# Patient Record
Sex: Female | Born: 1965 | Race: Black or African American | Hispanic: No | Marital: Married | Smoking: Never smoker
Health system: Southern US, Community
[De-identification: ages and names within clinical notes are randomized; demographics above are authoritative.]

## PROBLEM LIST (undated history)

## (undated) DIAGNOSIS — L732 Hidradenitis suppurativa: Secondary | ICD-10-CM

## (undated) HISTORY — PX: HYDRADENITIS EXCISION: SHX5243

## (undated) HISTORY — PX: BREAST BIOPSY: SHX20

## (undated) HISTORY — PX: BREAST CYST ASPIRATION: SHX578

---

## 2006-06-19 ENCOUNTER — Ambulatory Visit: Payer: Self-pay | Admitting: General Surgery

## 2007-08-02 ENCOUNTER — Ambulatory Visit: Payer: Self-pay | Admitting: General Surgery

## 2008-08-21 ENCOUNTER — Ambulatory Visit: Payer: Self-pay | Admitting: General Surgery

## 2008-08-22 ENCOUNTER — Ambulatory Visit: Payer: Self-pay | Admitting: General Surgery

## 2008-08-24 ENCOUNTER — Ambulatory Visit: Payer: Self-pay | Admitting: General Surgery

## 2015-10-01 ENCOUNTER — Encounter: Payer: Self-pay | Admitting: *Deleted

## 2015-10-01 ENCOUNTER — Ambulatory Visit
Admission: EM | Admit: 2015-10-01 | Discharge: 2015-10-01 | Disposition: A | Discharge status: Home / Self Care | Payer: Managed Care, Other (non HMO) | Attending: Family Medicine | Admitting: Family Medicine

## 2015-10-01 DIAGNOSIS — H109 Unspecified conjunctivitis: Secondary | ICD-10-CM

## 2015-10-01 HISTORY — DX: Hidradenitis suppurativa: L73.2

## 2015-10-01 MED ORDER — MOXIFLOXACIN HCL 0.5 % OP SOLN: 1.0000 [drp] | Freq: Three times a day (TID) | OPHTHALMIC | Status: AC

## 2015-10-01 NOTE — ED Notes (Signed)
Patient awoke this past Saturday with redness in her left eye. This am she was unable to open her left eye and pain is present.

## 2015-10-01 NOTE — ED Provider Notes (Signed)
CSN: [id number]     Arrival date & time 10/01/15  0947 History   First MD Initiated Contact with Patient 10/01/15 1056     Chief Complaint  Patient presents with  . Conjunctivitis    left eye   (Consider location/radiation/quality/duration/timing/severity/associated sxs/prior Treatment) Patient is a 50 y.o. female presenting with conjunctivitis. The history is provided by the patient.  Conjunctivitis This is a new (states exposure to sick contact with conjunctivitis recently) problem. The current episode started 2 days ago. The problem occurs constantly. The problem has been gradually worsening. Associated symptoms comments: Left eye redness, drainage; matted shut in am.    Past Medical History  Diagnosis Date  . Hydradenitis    Past Surgical History  Procedure Laterality Date  . Hydradenitis excision Left     axillary, groin   History reviewed. No pertinent family history. Social History  Substance Use Topics  . Smoking status: Never Smoker   . Smokeless tobacco: Never Used  . Alcohol Use: No   OB History    No data available     Review of Systems  Allergies  Review of patient's allergies indicates no known allergies.  Home Medications   Prior to Admission medications   Medication Sig Start Date End Date Taking? Authorizing Provider  moxifloxacin (VIGAMOX) 0.5 % ophthalmic solution Place 1 drop into the left eye 3 (three) times daily. 10/01/15   Payton Mccallumrlando Osbaldo Mark, MD   Meds Ordered and Administered this Visit  Medications - No data to display  BP 146/78 mmHg  Pulse 88  Temp(Src) 98.1 F (36.7 C) (Oral)  Resp 18  Ht 5\' 7"  (1.702 m)  Wt 240 lb (108.863 kg)  BMI 37.58 kg/m2  SpO2 96% No data found.   Physical Exam  Constitutional: She appears well-nourished. No distress.  Eyes: EOM are normal. Pupils are equal, round, and reactive to light. Right eye exhibits no discharge and no exudate. Left eye exhibits discharge. Right conjunctiva is not injected. Right  conjunctiva has no hemorrhage. Left conjunctiva is injected. Left conjunctiva has no hemorrhage.  Skin: She is not diaphoretic.  Nursing note and vitals reviewed.   ED Course  Procedures (including critical care time)  Labs Review Labs Reviewed - No data to display  Imaging Review No results found.   Visual Acuity Review  Right Eye Distance:   Left Eye Distance:   Bilateral Distance:    Right Eye Near:   Left Eye Near:    Bilateral Near:         MDM   1. Conjunctivitis, left eye    New Prescriptions   MOXIFLOXACIN (VIGAMOX) 0.5 % OPHTHALMIC SOLUTION    Place 1 drop into the left eye 3 (three) times daily.   1.  diagnosis reviewed with patient 2. rx as per orders above; reviewed possible side effects, interactions, risks and benefits  3. Recommend supportive treatment with cool compresses 4. Follow-up prn if symptoms worsen or don't improve    Payton Mccallumrlando Desira Alessandrini, MD 10/01/15 1108

## 2016-04-09 ENCOUNTER — Other Ambulatory Visit: Payer: Self-pay | Admitting: Family Medicine

## 2016-04-09 DIAGNOSIS — Z1231 Encounter for screening mammogram for malignant neoplasm of breast: Secondary | ICD-10-CM

## 2016-04-21 ENCOUNTER — Ambulatory Visit: Admission: RE | Admit: 2016-04-21 | Payer: Managed Care, Other (non HMO) | Source: Ambulatory Visit

## 2016-10-23 HISTORY — PX: BREAST CYST EXCISION: SHX579

## 2016-11-24 ENCOUNTER — Ambulatory Visit
Admission: EM | Admit: 2016-11-24 | Discharge: 2016-11-24 | Disposition: A | Discharge status: Home / Self Care | Payer: Managed Care, Other (non HMO) | Attending: Family Medicine | Admitting: Family Medicine

## 2016-11-24 DIAGNOSIS — Z5189 Encounter for other specified aftercare: Secondary | ICD-10-CM

## 2016-11-24 NOTE — ED Triage Notes (Addendum)
Patient is here today for removal of packing from left axillary abscess that was placed on Friday evening. Patient was placed on Clindamycin and Oxycodone from [facility].

## 2016-11-24 NOTE — ED Provider Notes (Signed)
[facility]    CSN: [id number] Arrival date & time: 11/24/16  1247     History   Chief Complaint Chief Complaint  Patient presents with  . Wound Check    HPI Christie Castro is a 51 y.o. female.   51 yo female with a c/o abscess to the left axilla that was I&D'd in the ED 3 days ago. Wound was packed and patient started on oral antibiotic. Patient here for recheck of wound and repacking. States symptoms are improving. Denies any fevers or chills.    The history is provided by the patient.  Wound Check  This is a new problem. The current episode started more than 2 days ago.    Past Medical History:  Diagnosis Date  . Hydradenitis     There are no active problems to display for this patient.   Past Surgical History:  Procedure Laterality Date  . HYDRADENITIS EXCISION Left    axillary, groin    OB History    No data available       Home Medications    Prior to Admission medications   Medication Sig Start Date End Date Taking? Authorizing Provider  clindamycin (CLEOCIN) 150 MG capsule Take 300 mg by mouth every 6 (six) hours.   Yes Historical Provider, MD  oxyCODONE-acetaminophen (PERCOCET/ROXICET) 5-325 MG tablet Take by mouth every 4 (four) hours as needed for severe pain.   Yes Historical Provider, MD  moxifloxacin (VIGAMOX) 0.5 % ophthalmic solution Place 1 drop into the left eye 3 (three) times daily. 10/01/15   Christie Mccallumrlando Kecia Swoboda, MD    Family History History reviewed. No pertinent family history.  Social History Social History  Substance Use Topics  . Smoking status: Never Smoker  . Smokeless tobacco: Never Used  . Alcohol use No     Allergies   Patient has no known allergies.   Review of Systems Review of Systems   Physical Exam Triage Vital Signs ED Triage Vitals  Enc Vitals Group     BP 11/24/16 1302 (!) 151/86     Pulse Rate 11/24/16 1302 (!) 103     Resp 11/24/16 1302 18     Temp 11/24/16 1302 98.1 F (36.7 C)     Temp  Source 11/24/16 1302 Oral     SpO2 11/24/16 1302 98 %     Weight 11/24/16 1301 240 lb (108.9 kg)     Height 11/24/16 1301 5\' 7"  (1.702 m)     Head Circumference --      Peak Flow --      Pain Score 11/24/16 1304 5     Pain Loc --      Pain Edu? --      Excl. in GC? --    No data found.   Updated Vital Signs BP (!) 151/86 (BP Location: Left Arm)   Pulse (!) 103   Temp 98.1 F (36.7 C) (Oral)   Resp 18   Ht 5\' 7"  (1.702 m)   Wt 240 lb (108.9 kg)   SpO2 98%   BMI 37.59 kg/m   Visual Acuity Right Eye Distance:   Left Eye Distance:   Bilateral Distance:    Right Eye Near:   Left Eye Near:    Bilateral Near:     Physical Exam  Constitutional: She appears well-developed and well-nourished. No distress.  Skin: She is not diaphoretic.  Nursing note and vitals reviewed.    UC Treatments / Results  Labs (all labs  ordered are listed, but only abnormal results are displayed) Labs Reviewed - No data to display  EKG  EKG Interpretation None       Radiology No results found.  Procedures Procedures (including critical care time)  Medications Ordered in UC Medications - No data to display   Initial Impression / Assessment and Plan / UC Course  I have reviewed the triage vital signs and the nursing notes.  Pertinent labs & imaging results that were available during my care of the patient were reviewed by me and considered in my medical decision making (see chart for details).       Final Clinical Impressions(s) / UC Diagnoses   Final diagnoses:  Abscess re-check    New Prescriptions Discharge Medication List as of 11/24/2016  1:46 PM     1. diagnosis reviewed with patient 2. Packing removed from wound, purulent drainage expresses from wound and wound repacked  3. Recommend supportive treatment with warm compresses to area 4. Continue current oral antibiotic 4. Follow-up in 2-3 days for recheck and repacking of wound or prn if symptoms worsen      Christie Mccallum, MD 11/24/16 2000

## 2016-11-24 NOTE — Discharge Instructions (Signed)
Continue warm compresses to area and current antibiotic Follow up in 2-3 days for recheck and repacking

## 2016-11-27 ENCOUNTER — Telehealth: Payer: Self-pay

## 2016-11-27 ENCOUNTER — Ambulatory Visit
Admission: EM | Admit: 2016-11-27 | Discharge: 2016-11-27 | Disposition: A | Discharge status: Home / Self Care | Payer: BLUE CROSS/BLUE SHIELD | Attending: Family Medicine | Admitting: Family Medicine

## 2016-11-27 DIAGNOSIS — Z5189 Encounter for other specified aftercare: Secondary | ICD-10-CM | POA: Diagnosis not present

## 2016-11-27 MED ORDER — MUPIROCIN 2 % EX OINT: 1.0000 "application " | TOPICAL_OINTMENT | Freq: Three times a day (TID) | CUTANEOUS | 0 refills | Status: DC

## 2016-11-27 MED ORDER — MUPIROCIN 2 % EX OINT: 1.0000 "application " | TOPICAL_OINTMENT | Freq: Three times a day (TID) | CUTANEOUS | 1 refills | Status: AC

## 2016-11-27 NOTE — ED Provider Notes (Signed)
[facility]    CSN: [id number] Arrival date & time: 11/27/16  1450     History   Chief Complaint Chief Complaint  Patient presents with  . Wound Check    HPI Christie Castro is a 51 y.o. female.   Patient's here because of wound check. Please see note that was dictated 3 days ago. She had I&D and [facility] first time was unsuccessful second time on Friday followed by visit here on Monday for repacking. She is hoping that the wound does not need to be repacked again. She has a history of hidradenitis. She is also no known history of drug allergies nor does she smoke.   The history is provided by the patient. No language interpreter was used.    Past Medical History:  Diagnosis Date  . Hydradenitis     There are no active problems to display for this patient.   Past Surgical History:  Procedure Laterality Date  . HYDRADENITIS EXCISION Left    axillary, groin    OB History    No data available       Home Medications    Prior to Admission medications   Medication Sig Start Date End Date Taking? Authorizing Provider  clindamycin (CLEOCIN) 150 MG capsule Take 300 mg by mouth every 6 (six) hours.   Yes Historical Provider, MD  moxifloxacin (VIGAMOX) 0.5 % ophthalmic solution Place 1 drop into the left eye 3 (three) times daily. 10/01/15  Yes [name], MD  oxyCODONE-acetaminophen (PERCOCET/ROXICET) 5-325 MG tablet Take by mouth every 4 (four) hours as needed for severe pain.   Yes Historical Provider, MD  mupirocin ointment (BACTROBAN) 2 % Apply 1 application topically 3 (three) times daily. 11/27/16   Hassan Rowan, MD    Family History History reviewed. No pertinent family history.  Social History Social History  Substance Use Topics  . Smoking status: Never Smoker  . Smokeless tobacco: Never Used  . Alcohol use No     Allergies   Patient has no known allergies.   Review of Systems Review of Systems  Skin: Positive for wound.  All other systems  reviewed and are negative.    Physical Exam Triage Vital Signs ED Triage Vitals  Enc Vitals Group     BP 11/27/16 1547 137/80     Pulse Rate 11/27/16 1547 79     Resp 11/27/16 1547 17     Temp 11/27/16 1547 98.5 F (36.9 C)     Temp Source 11/27/16 1547 Oral     SpO2 11/27/16 1547 100 %     Weight 11/27/16 1546 240 lb (108.9 kg)     Height 11/27/16 1546 5\' 7"  (1.702 m)     Head Circumference --      Peak Flow --      Pain Score 11/27/16 1547 0     Pain Loc --      Pain Edu? --      Excl. in GC? --    No data found.   Updated Vital Signs BP 137/80 (BP Location: Left Arm)   Pulse 79   Temp 98.5 F (36.9 C) (Oral)   Resp 17   Ht 5\' 7"  (1.702 m)   Wt 240 lb (108.9 kg)   SpO2 100%   BMI 37.59 kg/m   Visual Acuity Right Eye Distance:   Left Eye Distance:   Bilateral Distance:    Right Eye Near:   Left Eye Near:    Bilateral Near:  Physical Exam  Constitutional: She is oriented to person, place, and time. She appears well-developed and well-nourished.  HENT:  Head: Normocephalic.  Right Ear: External ear normal.  Left Ear: External ear normal.  Eyes: Pupils are equal, round, and reactive to light.  Neck: Normal range of motion.  Pulmonary/Chest: Effort normal.  Musculoskeletal: Normal range of motion.  Neurological: She is alert and oriented to person, place, and time.  Skin: Skin is warm. There is erythema.  She has had a left axillary wound that is gaping open and draining. Packing was removed. We'll place him back pain ointment 2-3 times a day and follow-up with  Psychiatric: She has a normal mood and affect.  Vitals reviewed.    UC Treatments / Results  Labs (all labs ordered are listed, but only abnormal results are displayed) Labs Reviewed - No data to display  EKG  EKG Interpretation None       Radiology No results found.  Procedures Procedures (including critical care time)  Medications Ordered in UC Medications - No data to  display   Initial Impression / Assessment and Plan / UC Course  I have reviewed the triage vital signs and the nursing notes.  Pertinent labs & imaging results that were available during my care of the patient were reviewed by me and considered in my medical decision making (see chart for details).     Will continue using Bactroban ointment to 3 times a day as needed for his antibiotic return as needed  Final Clinical Impressions(s) / UC Diagnoses   Final diagnoses:  Visit for wound check  Wound check, abscess    New Prescriptions New Prescriptions   MUPIROCIN OINTMENT (BACTROBAN) 2 %    Apply 1 application topically 3 (three) times daily.     Hassan RowanEugene Royanne Warshaw, MD 11/27/16 [phone]

## 2016-11-27 NOTE — Telephone Encounter (Signed)
Courtesy call back completed today after patient's visit at [facility]. Patient improved and will call back with any questions or concerns.  

## 2016-11-27 NOTE — ED Triage Notes (Signed)
Patient states that area is better and has not been draining like it was.

## 2017-01-22 ENCOUNTER — Other Ambulatory Visit: Payer: Self-pay | Admitting: Medical Oncology

## 2017-01-22 DIAGNOSIS — Z1231 Encounter for screening mammogram for malignant neoplasm of breast: Secondary | ICD-10-CM

## 2017-01-26 ENCOUNTER — Inpatient Hospital Stay: Admission: RE | Admit: 2017-01-26 | Payer: BLUE CROSS/BLUE SHIELD | Source: Ambulatory Visit

## 2017-01-28 ENCOUNTER — Ambulatory Visit
Admission: RE | Admit: 2017-01-28 | Discharge: 2017-01-28 | Disposition: A | Discharge status: Home / Self Care | Payer: BLUE CROSS/BLUE SHIELD | Source: Ambulatory Visit | Attending: Medical Oncology | Admitting: Medical Oncology

## 2017-01-28 DIAGNOSIS — Z1231 Encounter for screening mammogram for malignant neoplasm of breast: Secondary | ICD-10-CM | POA: Diagnosis not present

## 2017-02-02 ENCOUNTER — Other Ambulatory Visit: Payer: Self-pay | Admitting: Medical Oncology

## 2017-02-02 DIAGNOSIS — N6310 Unspecified lump in the right breast, unspecified quadrant: Secondary | ICD-10-CM

## 2017-02-02 DIAGNOSIS — R928 Other abnormal and inconclusive findings on diagnostic imaging of breast: Secondary | ICD-10-CM

## 2017-02-20 ENCOUNTER — Ambulatory Visit
Admission: RE | Admit: 2017-02-20 | Discharge: 2017-02-20 | Disposition: A | Discharge status: Home / Self Care | Payer: BLUE CROSS/BLUE SHIELD | Source: Ambulatory Visit | Attending: Medical Oncology | Admitting: Medical Oncology

## 2017-02-20 ENCOUNTER — Other Ambulatory Visit: Payer: BLUE CROSS/BLUE SHIELD

## 2017-03-09 ENCOUNTER — Other Ambulatory Visit: Payer: BLUE CROSS/BLUE SHIELD

## 2017-03-09 ENCOUNTER — Ambulatory Visit: Payer: BLUE CROSS/BLUE SHIELD | Attending: Medical Oncology

## 2020-01-04 ENCOUNTER — Other Ambulatory Visit: Payer: Self-pay | Admitting: Medical Oncology

## 2020-01-04 DIAGNOSIS — R928 Other abnormal and inconclusive findings on diagnostic imaging of breast: Secondary | ICD-10-CM

## 2020-01-13 ENCOUNTER — Ambulatory Visit
Admission: RE | Admit: 2020-01-13 | Discharge: 2020-01-13 | Disposition: A | Discharge status: Home / Self Care | Payer: BLUE CROSS/BLUE SHIELD | Source: Ambulatory Visit | Attending: Medical Oncology | Admitting: Medical Oncology

## 2020-01-13 DIAGNOSIS — R928 Other abnormal and inconclusive findings on diagnostic imaging of breast: Secondary | ICD-10-CM

## 2020-10-08 IMAGING — MG DIGITAL DIAGNOSTIC BILAT W/ TOMO W/ CAD
6 of 10 series · 6 of 30 positions shown · non-contrast
Comparison: Previous exam(s).

CLINICAL DATA: Patient was called back from screening mammogram in
Friday January, 2017 for a right breast mass. The patient did not return for
the additional imaging.

EXAM:
DIGITAL DIAGNOSTIC BILATERAL MAMMOGRAM WITH CAD AND TOMO
ULTRASOUND RIGHT BREAST

[R MLO synth-2D]
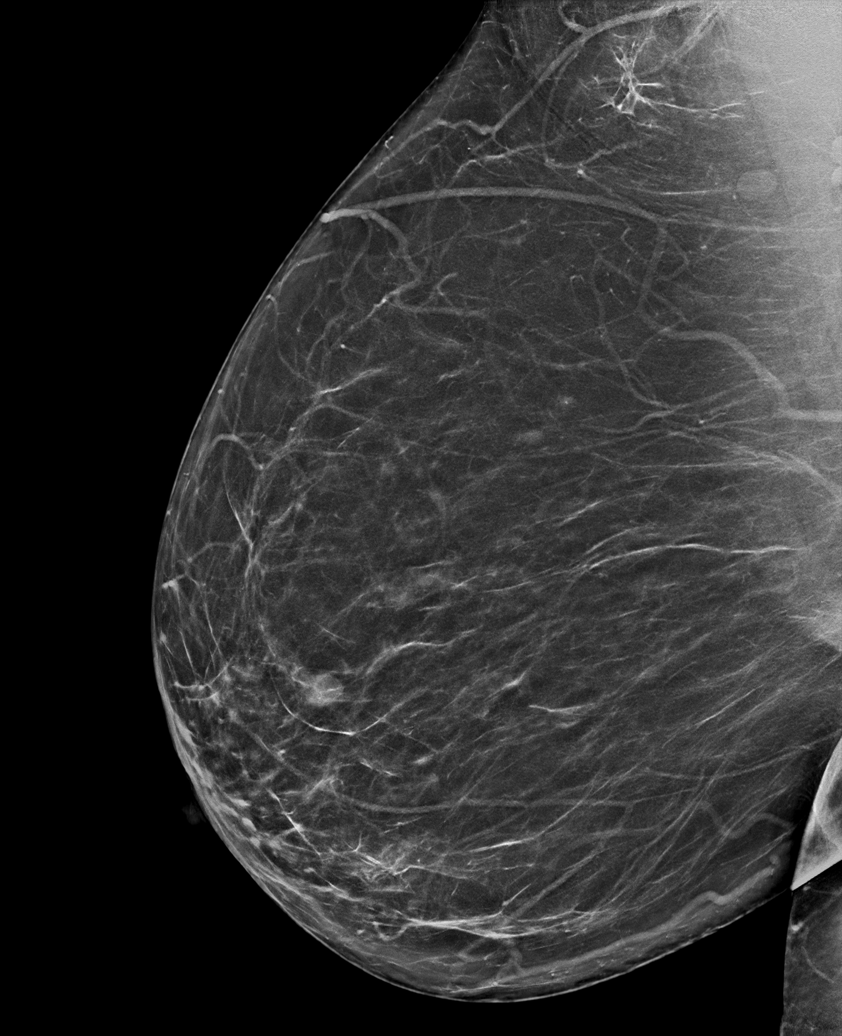

[R XCCL synth-2D]
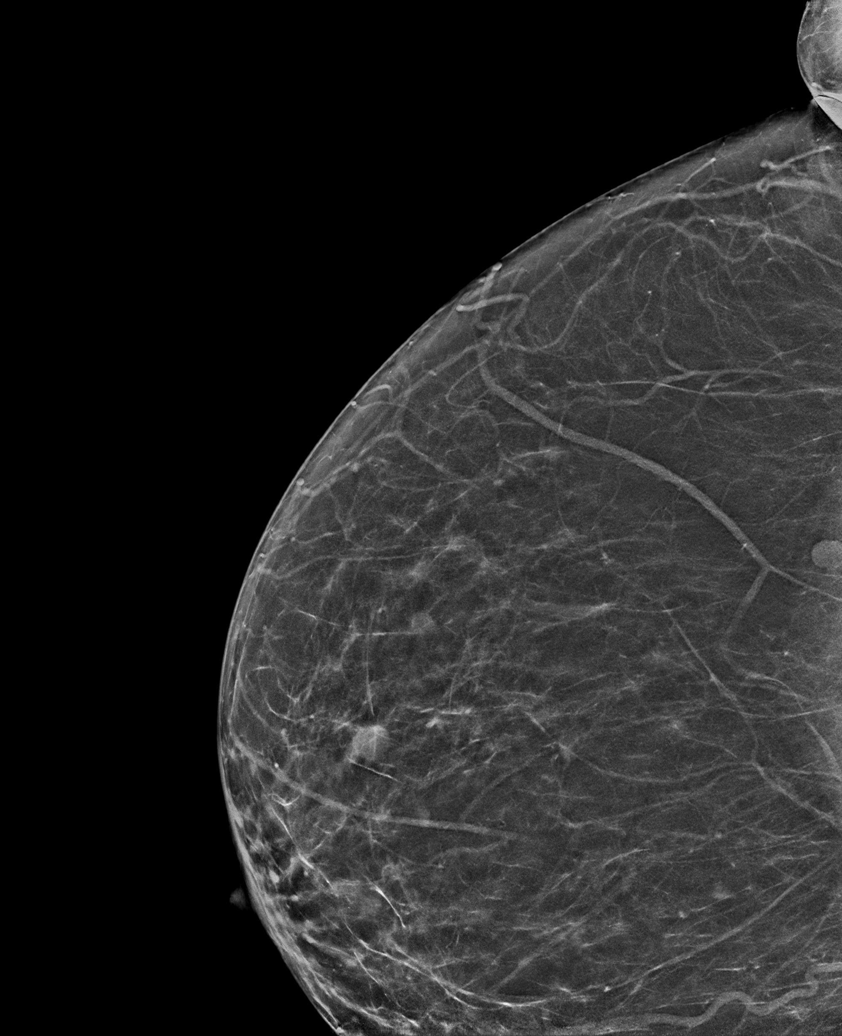

[L MLO synth-2D]
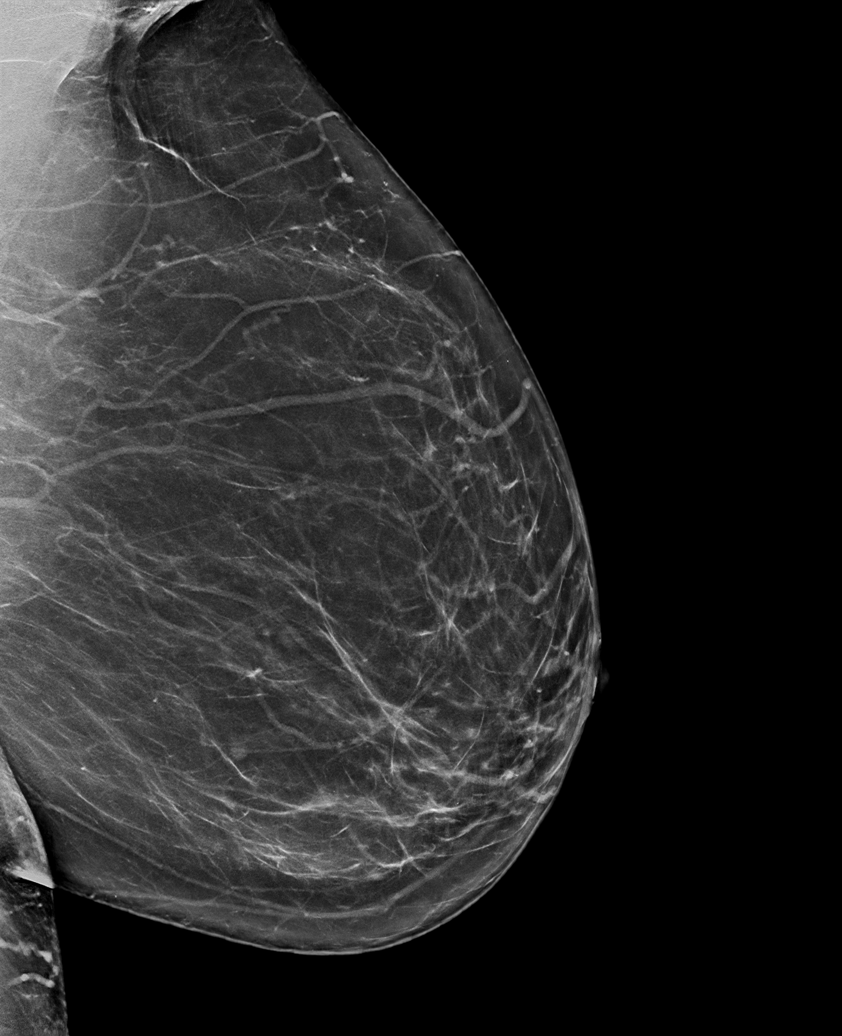

[L CC synth-2D]
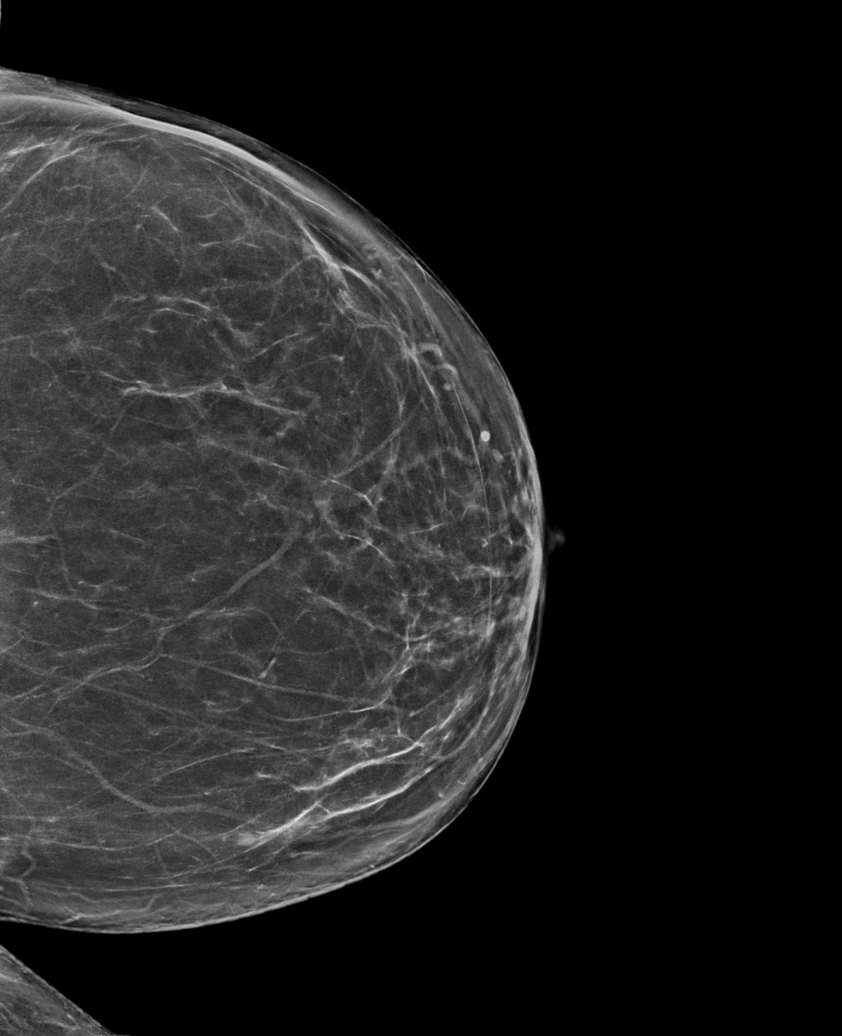

[R CC synth-2D]
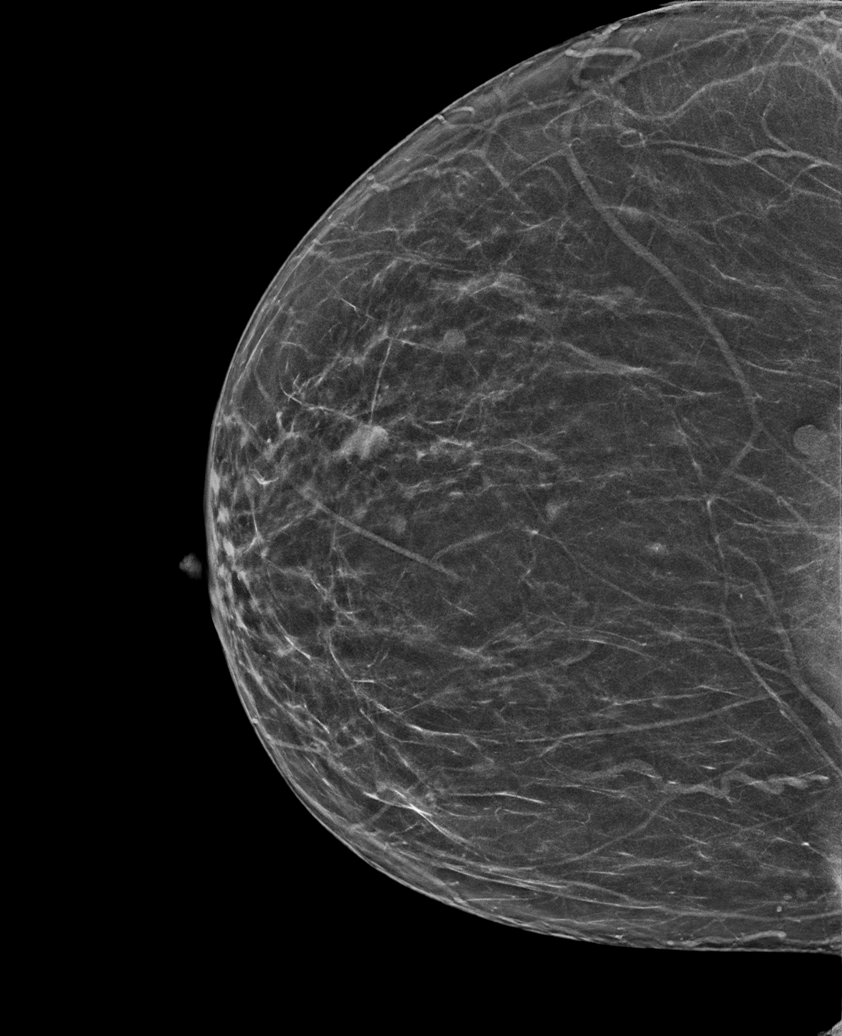

[L CC tomo · tomo slice 36/71.0]
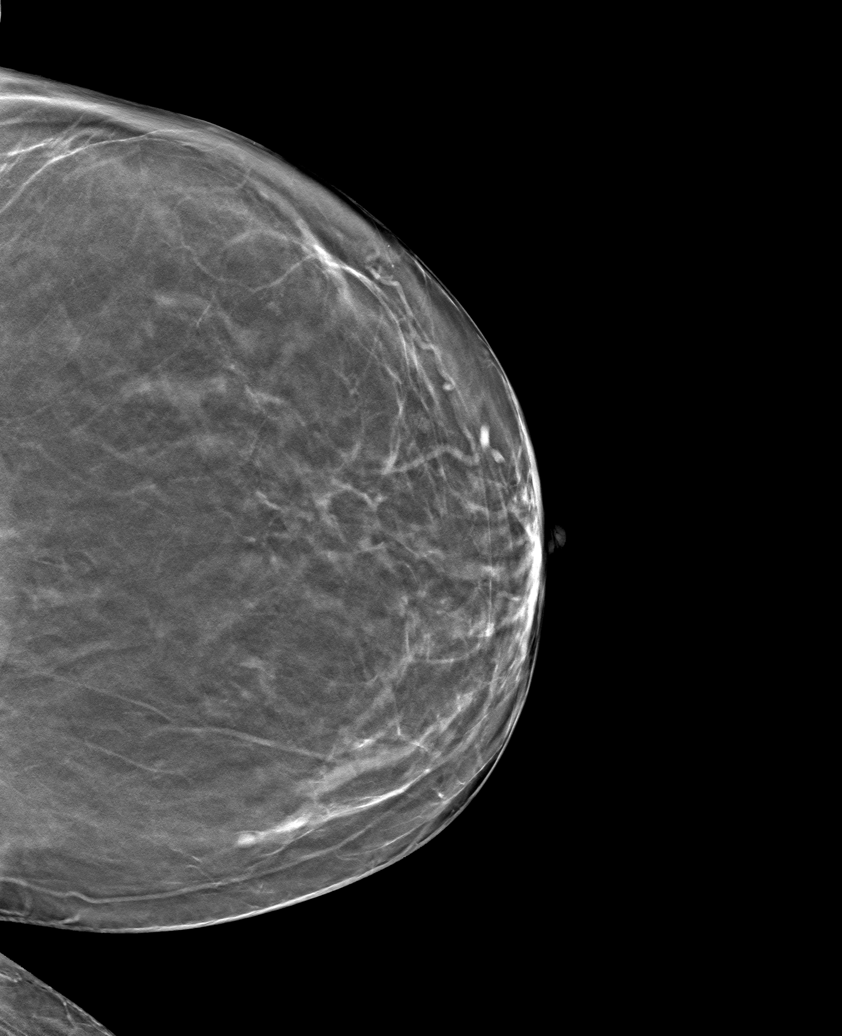

[6 of 30 positions shown; findings below may reference images not displayed]

ACR Breast Density Category b: There are scattered areas of
fibroglandular density.
FINDINGS: There is a 1 cm mass in the anterior third of the lateral aspect of
the right breast. It is slightly smaller than the prior exam dated
01/28/2017. No suspicious mass or malignant type microcalcifications
identified in either breast.

Mammographic images were processed with CAD.

Targeted ultrasound is performed, showing a well-circumscribed
hypoechoic mass in the right breast at 9 o'clock in the retroareolar
region measuring 8 x 8 x 9 mm corresponding with the mammographic
abnormality.
IMPRESSION: Stable benign-appearing mass in the right breast. No evidence of
malignancy in either breast.

RECOMMENDATION:
Bilateral screening mammogram in 1 year is recommended.

I have discussed the findings and recommendations with the patient.
If applicable, a reminder letter will be sent to the patient
regarding the next appointment.

BI-RADS CATEGORY  2: Benign.

## 2023-05-20 ENCOUNTER — Ambulatory Visit: Payer: BLUE CROSS/BLUE SHIELD

## 2023-07-29 ENCOUNTER — Ambulatory Visit: Payer: BLUE CROSS/BLUE SHIELD | Admitting: Internal Medicine

## 2023-07-30 NOTE — Progress Notes (Signed)
No show
